# Patient Record
Sex: Female | Born: 1955 | Race: White | Hispanic: No | Smoking: Former smoker
Health system: Southern US, Community
[De-identification: ages and names within clinical notes are randomized; demographics above are authoritative.]

## PROBLEM LIST (undated history)

## (undated) DIAGNOSIS — K589 Irritable bowel syndrome without diarrhea: Secondary | ICD-10-CM

## (undated) DIAGNOSIS — M8580 Other specified disorders of bone density and structure, unspecified site: Secondary | ICD-10-CM

## (undated) DIAGNOSIS — J45909 Unspecified asthma, uncomplicated: Secondary | ICD-10-CM

## (undated) DIAGNOSIS — E039 Hypothyroidism, unspecified: Secondary | ICD-10-CM

## (undated) DIAGNOSIS — K5909 Other constipation: Secondary | ICD-10-CM

## (undated) DIAGNOSIS — E785 Hyperlipidemia, unspecified: Secondary | ICD-10-CM

## (undated) HISTORY — PX: COLONOSCOPY: SHX174

---

## 2018-03-28 ENCOUNTER — Other Ambulatory Visit: Payer: Self-pay | Admitting: Internal Medicine

## 2018-03-28 DIAGNOSIS — Z1231 Encounter for screening mammogram for malignant neoplasm of breast: Secondary | ICD-10-CM

## 2018-04-11 ENCOUNTER — Inpatient Hospital Stay
Admission: RE | Admit: 2018-04-11 | Discharge: 2018-04-11 | Disposition: A | Discharge status: Home / Self Care | Payer: Self-pay | Source: Ambulatory Visit | Attending: *Deleted | Admitting: *Deleted

## 2018-04-11 ENCOUNTER — Other Ambulatory Visit: Payer: Self-pay | Admitting: *Deleted

## 2018-04-11 DIAGNOSIS — Z9289 Personal history of other medical treatment: Secondary | ICD-10-CM

## 2018-05-29 ENCOUNTER — Ambulatory Visit
Admission: RE | Admit: 2018-05-29 | Discharge: 2018-05-29 | Disposition: A | Discharge status: Home / Self Care | Payer: BLUE CROSS/BLUE SHIELD | Source: Ambulatory Visit | Attending: Internal Medicine | Admitting: Internal Medicine

## 2018-05-29 DIAGNOSIS — Z1231 Encounter for screening mammogram for malignant neoplasm of breast: Secondary | ICD-10-CM | POA: Diagnosis present

## 2019-04-21 ENCOUNTER — Ambulatory Visit: Payer: Medicare HMO | Attending: Internal Medicine

## 2019-04-21 DIAGNOSIS — Z23 Encounter for immunization: Secondary | ICD-10-CM | POA: Insufficient documentation

## 2019-04-21 NOTE — Progress Notes (Signed)
   Covid-19 Vaccination Clinic  Name:  Charlotte Flores    MRN: [id number] DOB: 1955-08-04  04/21/2019  [name] was observed post Covid-19 immunization for 15 minutes without incidence. She was provided with Vaccine Information Sheet and instruction to access the V-Safe system.   [name] was instructed to call 911 with any severe reactions post vaccine: Marland Kitchen Difficulty breathing  . Swelling of your face and throat  . A fast heartbeat  . A bad rash all over your body  . Dizziness and weakness    Immunizations Administered    Name Date Dose VIS Date Route   Pfizer COVID-19 Vaccine 04/21/2019  2:00 PM 0.3 mL 03/09/2019 Intramuscular   Manufacturer: [place]   Lot: [id number]   [place]: [id number]

## 2019-05-12 ENCOUNTER — Ambulatory Visit: Payer: Medicare HMO | Attending: Internal Medicine

## 2019-05-12 DIAGNOSIS — Z23 Encounter for immunization: Secondary | ICD-10-CM

## 2019-05-12 NOTE — Progress Notes (Signed)
   Covid-19 Vaccination Clinic  Name:  Charlotte Flores    MRN: [id number] DOB: 1956/01/17  05/12/2019  [name] was observed post Covid-19 immunization for 15 minutes without incidence. She was provided with Vaccine Information Sheet and instruction to access the V-Safe system.   [name] was instructed to call 911 with any severe reactions post vaccine: Marland Kitchen Difficulty breathing  . Swelling of your face and throat  . A fast heartbeat  . A bad rash all over your body  . Dizziness and weakness    Immunizations Administered    Name Date Dose VIS Date Route   Pfizer COVID-19 Vaccine 05/12/2019 12:45 PM 0.3 mL 03/09/2019 Intramuscular   Manufacturer: [place]   Lot: [id number]   [place]: [id number]

## 2020-11-03 NOTE — Anesthesia Preprocedure Evaluation (Addendum)
Anesthesia Evaluation  Patient identified by MRN, date of birth, ID band Patient awake    Reviewed: Allergy & Precautions, NPO status   Airway Mallampati: I  TM Distance: >3 FB Neck ROM: Full    Dental no notable dental hx. (+) Teeth Intact   Pulmonary former smoker,    Pulmonary exam normal        Cardiovascular Exercise Tolerance: Good Normal cardiovascular exam     Neuro/Psych    GI/Hepatic   Endo/Other  Hypothyroidism   Renal/GU      Musculoskeletal   Abdominal Normal abdominal exam  (+)   Peds  Hematology   Anesthesia Other Findings   Reproductive/Obstetrics                            Anesthesia Physical Anesthesia Plan  ASA: 2  Anesthesia Plan: General   Post-op Pain Management:    Induction: Intravenous  PONV Risk Score and Plan:   Airway Management Planned: Simple Face Mask  Additional Equipment:   Intra-op Plan:   Post-operative Plan:   Informed Consent: I have reviewed the patients History and Physical, chart, labs and discussed the procedure including the risks, benefits and alternatives for the proposed anesthesia with the patient or authorized representative who has indicated his/her understanding and acceptance.       Plan Discussed with: CRNA  Anesthesia Plan Comments:         Anesthesia Quick Evaluation

## 2020-11-04 ENCOUNTER — Other Ambulatory Visit: Payer: Self-pay

## 2020-11-04 ENCOUNTER — Ambulatory Visit: Payer: Medicare HMO | Admitting: Anesthesiology

## 2020-11-04 ENCOUNTER — Ambulatory Visit
Admission: RE | Admit: 2020-11-04 | Discharge: 2020-11-04 | Disposition: A | Discharge status: Home / Self Care | Payer: Medicare HMO | Attending: Gastroenterology | Admitting: Gastroenterology

## 2020-11-04 ENCOUNTER — Encounter: Payer: Self-pay | Admitting: *Deleted

## 2020-11-04 ENCOUNTER — Encounter
Admission: RE | Disposition: A | Discharge status: Home / Self Care | Payer: Self-pay | Source: Home / Self Care | Attending: Gastroenterology

## 2020-11-04 DIAGNOSIS — D123 Benign neoplasm of transverse colon: Secondary | ICD-10-CM | POA: Insufficient documentation

## 2020-11-04 DIAGNOSIS — D122 Benign neoplasm of ascending colon: Secondary | ICD-10-CM | POA: Diagnosis not present

## 2020-11-04 DIAGNOSIS — Z87891 Personal history of nicotine dependence: Secondary | ICD-10-CM | POA: Insufficient documentation

## 2020-11-04 DIAGNOSIS — K6389 Other specified diseases of intestine: Secondary | ICD-10-CM | POA: Insufficient documentation

## 2020-11-04 DIAGNOSIS — Z79899 Other long term (current) drug therapy: Secondary | ICD-10-CM | POA: Diagnosis not present

## 2020-11-04 DIAGNOSIS — K640 First degree hemorrhoids: Secondary | ICD-10-CM | POA: Insufficient documentation

## 2020-11-04 DIAGNOSIS — D124 Benign neoplasm of descending colon: Secondary | ICD-10-CM | POA: Insufficient documentation

## 2020-11-04 DIAGNOSIS — Z1211 Encounter for screening for malignant neoplasm of colon: Secondary | ICD-10-CM | POA: Insufficient documentation

## 2020-11-04 HISTORY — DX: Other constipation: K59.09

## 2020-11-04 HISTORY — DX: Hypothyroidism, unspecified: E03.9

## 2020-11-04 HISTORY — DX: Hyperlipidemia, unspecified: E78.5

## 2020-11-04 HISTORY — DX: Other specified disorders of bone density and structure, unspecified site: M85.80

## 2020-11-04 HISTORY — DX: Unspecified asthma, uncomplicated: J45.909

## 2020-11-04 HISTORY — DX: Irritable bowel syndrome, unspecified: K58.9

## 2020-11-04 HISTORY — PX: COLONOSCOPY WITH PROPOFOL: SHX5780

## 2020-11-04 SURGERY — COLONOSCOPY WITH PROPOFOL
Anesthesia: General

## 2020-11-04 MED ORDER — SODIUM CHLORIDE 0.9 % IV SOLN: INTRAVENOUS | Status: DC

## 2020-11-04 MED ORDER — PROPOFOL 500 MG/50ML IV EMUL
INTRAVENOUS | Status: AC
  Filled 2020-11-04: qty 50

## 2020-11-04 MED ORDER — PROPOFOL 500 MG/50ML IV EMUL
INTRAVENOUS | Status: DC | PRN
  Administered 2020-11-04: 120 ug/kg/min via INTRAVENOUS

## 2020-11-04 NOTE — Anesthesia Postprocedure Evaluation (Signed)
Anesthesia Post Note  Patient: Charlotte Flores  Procedure(s) Performed: COLONOSCOPY WITH PROPOFOL  Patient location during evaluation: PACU Anesthesia Type: General Level of consciousness: awake and alert Pain management: pain level controlled Vital Signs Assessment: post-procedure vital signs reviewed and stable Respiratory status: spontaneous breathing, nonlabored ventilation, respiratory function stable and patient connected to nasal cannula oxygen Cardiovascular status: stable and blood pressure returned to baseline Postop Assessment: no apparent nausea or vomiting Anesthetic complications: no   No notable events documented.   Last Vitals:  Vitals:   11/04/20 0830 11/04/20 0850  BP: (!) 161/73 (!) 164/83  Pulse: 65 65  Resp: 15 17  Temp:    SpO2: 100% 100%    Last Pain:  Vitals:   11/04/20 0810  TempSrc: Temporal  PainSc:                  Lennox Pippins

## 2020-11-04 NOTE — H&P (Signed)
Outpatient short stay form Pre-procedure 11/04/2020 7:39 AM Raylene Miyamoto MD, MPH  Primary Physician: Dr. Sabra Heck  Reason for visit:  Screening  History of present illness:   65 y/o lady with history of HLD with normal colonoscopy in 2007 here for screening colonoscopy. No blood thinners. No abdominal surgeries. No family history of GI malignancies.    Current Facility-Administered Medications:    0.9 %  sodium chloride infusion, , Intravenous, Continuous, Anothony Bursch, Hilton Cork, MD, Last Rate: 20 mL/hr at 11/04/20 0724, Continued from Pre-op at 11/04/20 0724  Medications Prior to Admission  Medication Sig Dispense Refill Last Dose   albuterol (VENTOLIN HFA) 108 (90 Base) MCG/ACT inhaler Inhale into the lungs every 6 (six) hours as needed for wheezing or shortness of breath.   Past Month   atorvastatin (LIPITOR) 20 MG tablet Take 20 mg by mouth daily.   Past Week   valACYclovir (VALTREX) 1000 MG tablet Take 1,000 mg by mouth 2 (two) times daily.   Past Week     No Active Allergies   Past Medical History:  Diagnosis Date   Asthma    Chronic constipation    Hyperlipidemia    Hypothyroidism    IBS (irritable bowel syndrome)    Osteopenia     Review of systems:  Otherwise negative.    Physical Exam  Gen: Alert, oriented. Appears stated age.  HEENT: PERRLA. Lungs: No respiratory distress CV: RRR Abd: soft, benign, no masses Ext: No edema    Planned procedures: Proceed with colonoscopy. The patient understands the nature of the planned procedure, indications, risks, alternatives and potential complications including but not limited to bleeding, infection, perforation, damage to internal organs and possible oversedation/side effects from anesthesia. The patient agrees and gives consent to proceed.  Please refer to procedure notes for findings, recommendations and patient disposition/instructions.     Raylene Miyamoto MD, MPH Gastroenterology 11/04/2020  7:39 AM

## 2020-11-04 NOTE — Anesthesia Procedure Notes (Signed)
Date/Time: 11/04/2020 7:51 AM Performed by: Vaughan Sine Pre-anesthesia Checklist: Patient identified, Emergency Drugs available, Suction available, Timeout performed and Patient being monitored Patient Re-evaluated:Patient Re-evaluated prior to induction Oxygen Delivery Method: Nasal cannula Preoxygenation: Pre-oxygenation with 100% oxygen Induction Type: IV induction Placement Confirmation: CO2 detector and positive ETCO2

## 2020-11-04 NOTE — Op Note (Signed)
[facility] Gastroenterology Patient Name: Charlotte Flores Procedure Date: 11/04/2020 7:26 AM MRN: [id number] Account #: [id number] Date of Birth: 1956/01/07 Admit Type: Outpatient Age: 65 Room: [facility] ENDO ROOM 1 Gender: Female Note Status: Finalized Procedure:             Colonoscopy Indications:           Screening for colorectal malignant neoplasm Providers:             Andrey Farmer MD, MD Referring MD:          Rusty Aus, MD (Referring MD) Medicines:             Monitored Anesthesia Care Complications:         No immediate complications. Estimated blood loss:                         Minimal. Procedure:             Pre-Anesthesia Assessment:                        - Prior to the procedure, a History and Physical was                         performed, and patient medications and allergies were                         reviewed. The patient is competent. The risks and                         benefits of the procedure and the sedation options and                         risks were discussed with the patient. All questions                         were answered and informed consent was obtained.                         Patient identification and proposed procedure were                         verified by the physician, the nurse, the anesthetist                         and the technician in the endoscopy suite. Mental                         Status Examination: alert and oriented. Airway                         Examination: normal oropharyngeal airway and neck                         mobility. Respiratory Examination: clear to                         auscultation. CV Examination: normal. Prophylactic  Antibiotics: The patient does not require prophylactic                         antibiotics. Prior Anticoagulants: The patient has                         taken no previous anticoagulant or antiplatelet                         agents. ASA  Grade Assessment: I - A normal, healthy                         patient. After reviewing the risks and benefits, the                         patient was deemed in satisfactory condition to                         undergo the procedure. The anesthesia plan was to use                         monitored anesthesia care (MAC). Immediately prior to                         administration of medications, the patient was                         re-assessed for adequacy to receive sedatives. The                         heart rate, respiratory rate, oxygen saturations,                         blood pressure, adequacy of pulmonary ventilation, and                         response to care were monitored throughout the                         procedure. The physical status of the patient was                         re-assessed after the procedure.                        After obtaining informed consent, the colonoscope was                         passed under direct vision. Throughout the procedure,                         the patient's blood pressure, pulse, and oxygen                         saturations were monitored continuously. The                         Colonoscope was introduced through the anus and  advanced to the the cecum, identified by appendiceal                         orifice and ileocecal valve. The colonoscopy was                         somewhat difficult due to significant looping.                         Successful completion of the procedure was aided by                         applying abdominal pressure. The patient tolerated the                         procedure well. The quality of the bowel preparation                         was good. Findings:      The perianal and digital rectal examinations were normal.      A diffuse area of moderate melanosis was found in the entire colon.      A 2 mm polyp was found in the ascending colon. The polyp was sessile.        The polyp was removed with a jumbo cold forceps. Resection and retrieval       were complete. Estimated blood loss was minimal.      A 1 mm polyp was found in the transverse colon. The polyp was sessile.       The polyp was removed with a jumbo cold forceps. Resection and retrieval       were complete. Estimated blood loss was minimal.      Two sessile polyps were found in the descending colon. The polyps were 1       to 2 mm in size. These polyps were removed with a jumbo cold forceps.       Resection and retrieval were complete. Estimated blood loss was minimal.      Internal hemorrhoids were found during retroflexion. The hemorrhoids       were Grade I (internal hemorrhoids that do not prolapse). Impression:            - Melanosis in the colon.                        - One 2 mm polyp in the ascending colon, removed with                         a jumbo cold forceps. Resected and retrieved.                        - One 1 mm polyp in the transverse colon, removed with                         a jumbo cold forceps. Resected and retrieved.                        - Two 1 to 2 mm polyps in the descending colon,  removed with a jumbo cold forceps. Resected and                         retrieved.                        - Internal hemorrhoids. Recommendation:        - Discharge patient to home.                        - Resume previous diet.                        - Continue present medications.                        - Await pathology results.                        - Repeat colonoscopy for surveillance based on                         pathology results.                        - Return to referring physician as previously                         scheduled. Procedure Code(s):     --- Professional ---                        [id number], Colonoscopy, flexible; with biopsy, single or                         multiple Diagnosis Code(s):     --- Professional ---                         K63.5, Polyp of colon                        Z12.11, Encounter for screening for malignant neoplasm                         of colon                        K63.89, Other specified diseases of intestine                        K64.0, First degree hemorrhoids CPT copyright 2019 American Medical [employer]. All rights reserved. The codes documented in this report are preliminary and upon coder review may  be revised to meet current compliance requirements. Andrey Farmer MD, MD 11/04/2020 8:12:48 AM Number of Addenda: 0 Note Initiated On: 11/04/2020 7:26 AM Estimated Blood Loss:  Estimated blood loss was minimal.      [facility]

## 2020-11-04 NOTE — Interval H&P Note (Signed)
History and Physical Interval Note:  11/04/2020 7:41 AM  Charlotte Flores  has presented today for surgery, with the diagnosis of SCREEN.  The various methods of treatment have been discussed with the patient and family. After consideration of risks, benefits and other options for treatment, the patient has consented to  Procedure(s): COLONOSCOPY WITH PROPOFOL (N/A) as a surgical intervention.  The patient's history has been reviewed, patient examined, no change in status, stable for surgery.  I have reviewed the patient's chart and labs.  Questions were answered to the patient's satisfaction.     Lesly Rubenstein  Ok to proceed with colonoscopy

## 2020-11-04 NOTE — Transfer of Care (Signed)
Immediate Anesthesia Transfer of Care Note  Patient: Charlotte Flores  Procedure(s) Performed: COLONOSCOPY WITH PROPOFOL  Patient Location: PACU  Anesthesia Type:General  Level of Consciousness: alert  and sedated  Airway & Oxygen Therapy: Patient Spontanous Breathing and Patient connected to nasal cannula oxygen  Post-op Assessment: Report given to RN and Post -op Vital signs reviewed and stable  Post vital signs: Reviewed and stable  Last Vitals:  Vitals Value Taken Time  BP    Temp    Pulse    Resp    SpO2      Last Pain:  Vitals:   11/04/20 0656  TempSrc: Temporal  PainSc: 0-No pain         Complications: No notable events documented.

## 2020-11-05 ENCOUNTER — Encounter: Payer: Self-pay | Admitting: Gastroenterology

## 2020-11-06 LAB — SURGICAL PATHOLOGY

## 2021-06-01 ENCOUNTER — Other Ambulatory Visit: Payer: Self-pay | Admitting: Internal Medicine

## 2021-06-01 DIAGNOSIS — Z1231 Encounter for screening mammogram for malignant neoplasm of breast: Secondary | ICD-10-CM

## 2021-07-09 ENCOUNTER — Ambulatory Visit
Admission: RE | Admit: 2021-07-09 | Discharge: 2021-07-09 | Disposition: A | Discharge status: Home / Self Care | Payer: Medicare HMO | Source: Ambulatory Visit | Attending: Internal Medicine | Admitting: Internal Medicine

## 2021-07-09 DIAGNOSIS — Z1231 Encounter for screening mammogram for malignant neoplasm of breast: Secondary | ICD-10-CM | POA: Insufficient documentation

## 2022-07-24 IMAGING — MG MM DIGITAL SCREENING BILAT W/ TOMO AND CAD
8 series · 8 of 24 positions shown · non-contrast
Comparison: Previous exam(s).

CLINICAL DATA: Screening.

EXAM:
DIGITAL SCREENING BILATERAL MAMMOGRAM WITH TOMOSYNTHESIS AND CAD
TECHNIQUE: Bilateral screening digital craniocaudal and mediolateral oblique
mammograms were obtained. Bilateral screening digital breast
tomosynthesis was performed. The images were evaluated with
computer-aided detection.

[R CC synth-2D]
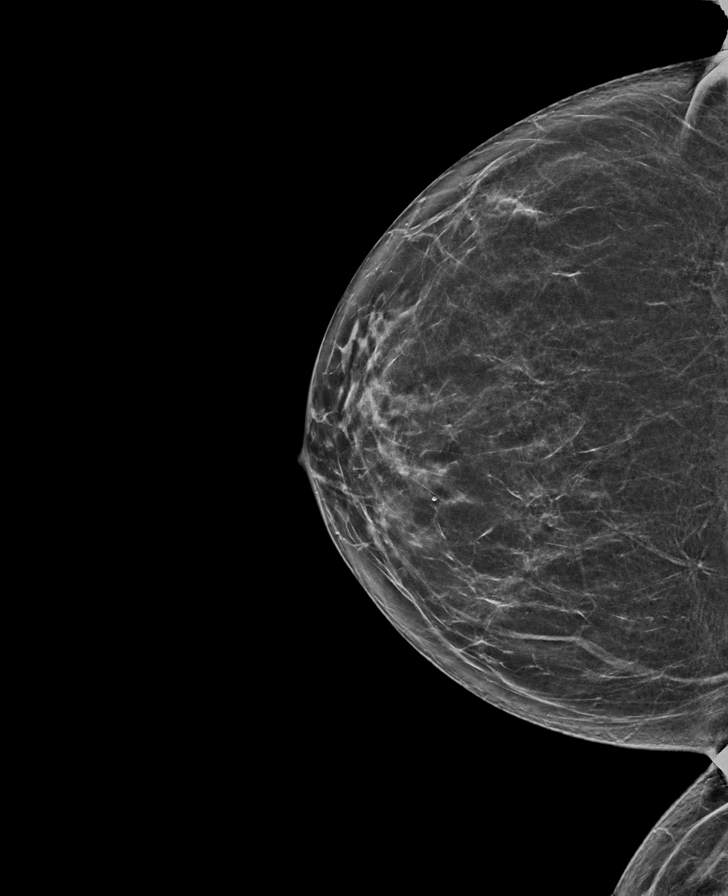

[L CC synth-2D]
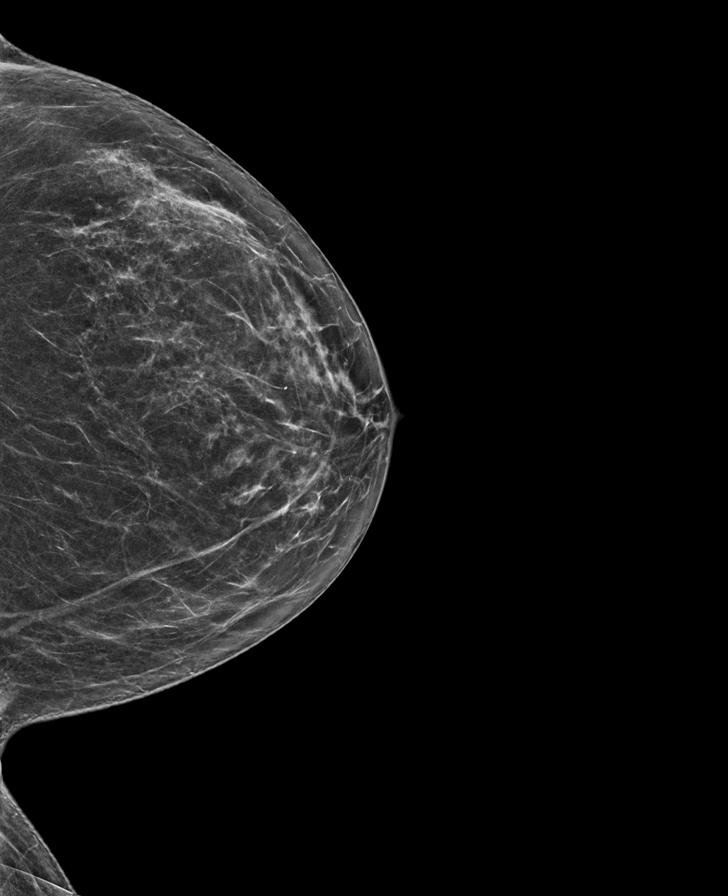

[L MLO synth-2D]
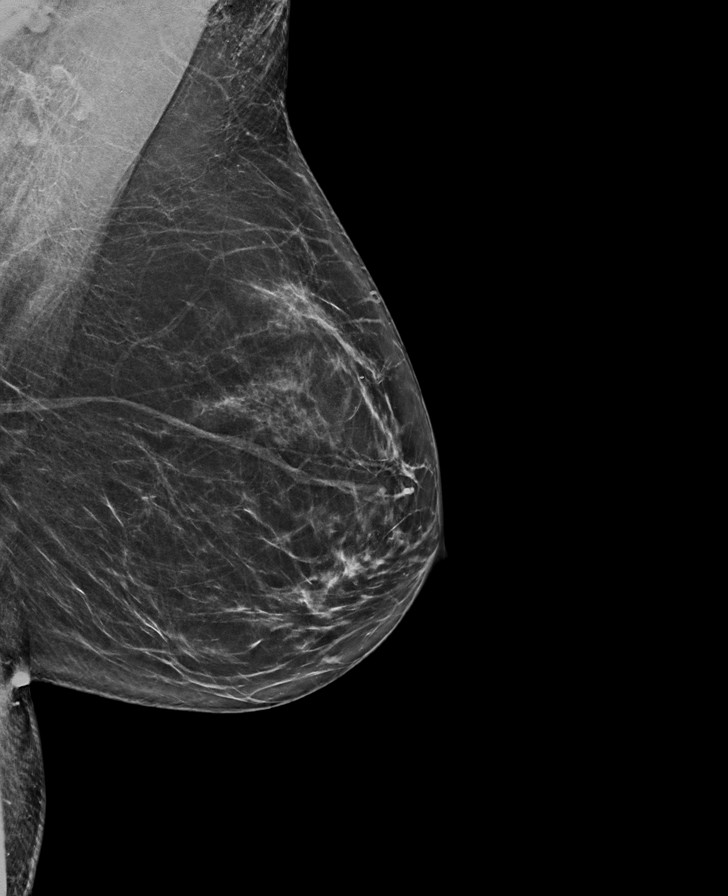

[R MLO synth-2D]
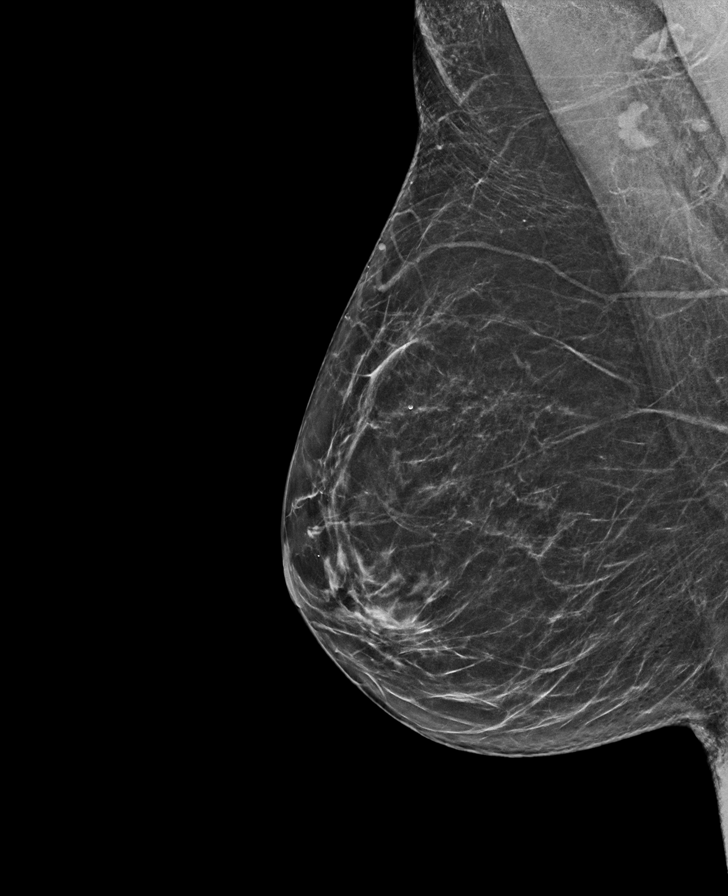

[R CC tomo · tomo slice 35/68.0]
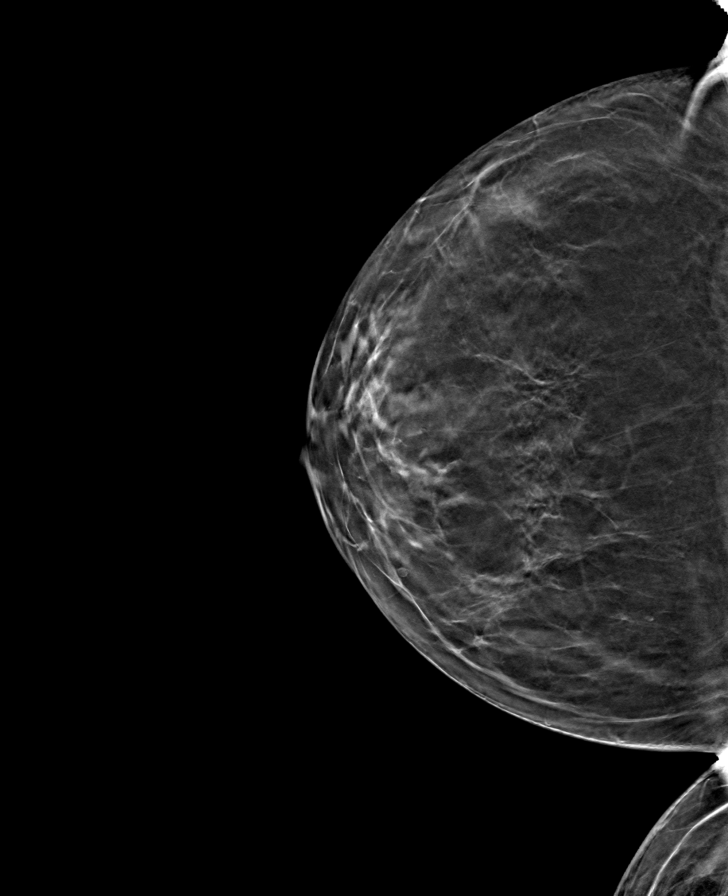

[L MLO tomo · tomo slice 37/73.0]
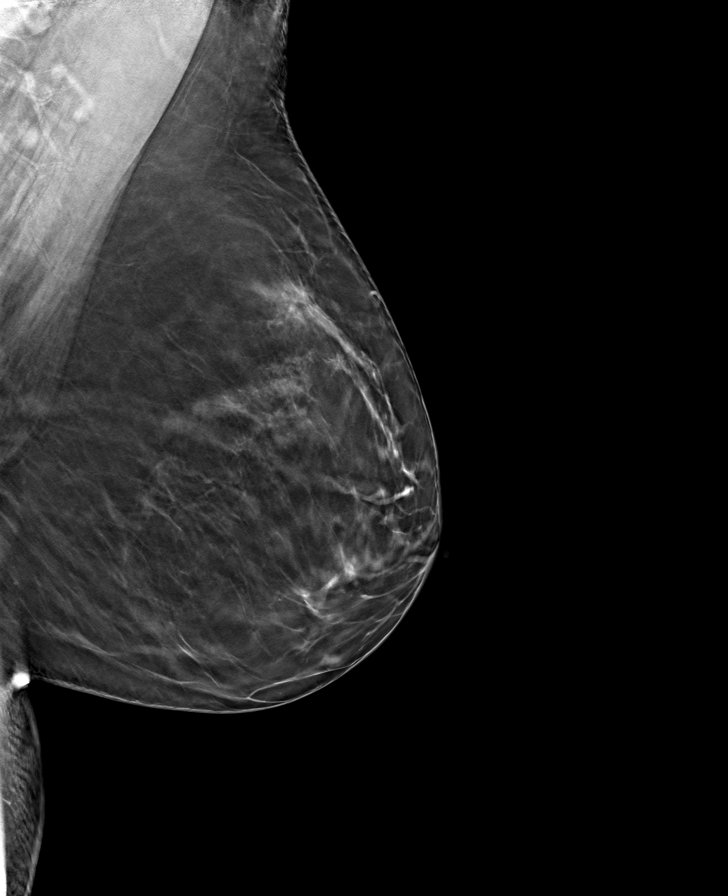

[R MLO tomo · tomo slice 37/74.0]
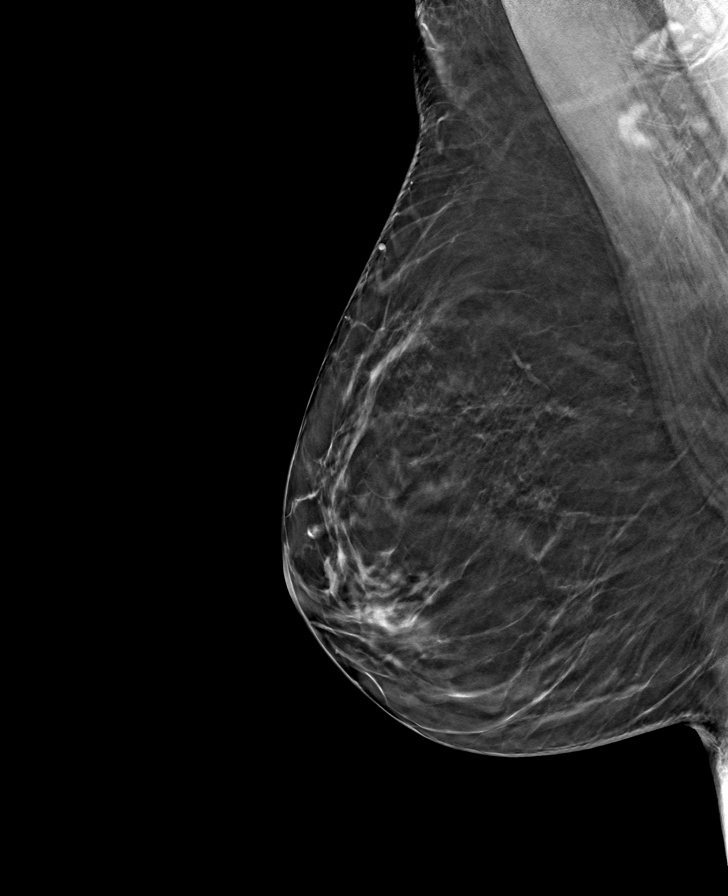

[L CC tomo · tomo slice 35/69.0]
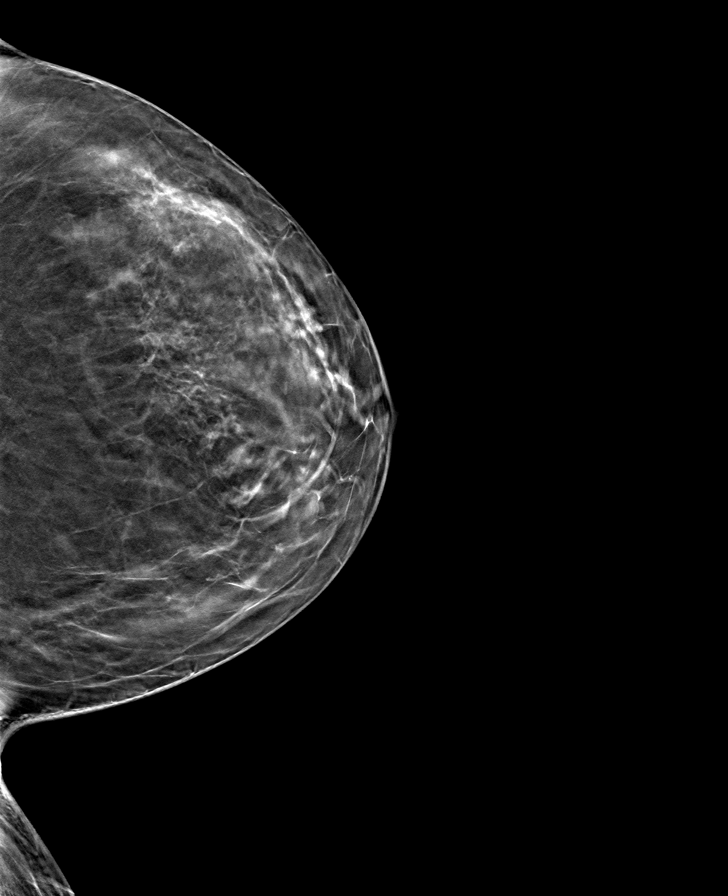

[8 of 24 positions shown; findings below may reference images not displayed]

ACR Breast Density Category b: There are scattered areas of
fibroglandular density.
FINDINGS: There are no findings suspicious for malignancy.
IMPRESSION: No mammographic evidence of malignancy. A result letter of this
screening mammogram will be mailed directly to the patient.

RECOMMENDATION:
Screening mammogram in one year. (Code:51-O-LD2)

BI-RADS CATEGORY  1: Negative.
